# Patient Record
Sex: Male | Born: 1935 | Race: White | Hispanic: No | Marital: Single
Health system: Southern US, Community
[De-identification: ages and names within clinical notes are randomized; demographics above are authoritative.]

---

## 1997-10-08 ENCOUNTER — Other Ambulatory Visit: Admission: RE | Admit: 1997-10-08 | Discharge: 1997-10-08 | Payer: Self-pay | Admitting: Nephrology

## 1997-10-11 ENCOUNTER — Ambulatory Visit (HOSPITAL_BASED_OUTPATIENT_CLINIC_OR_DEPARTMENT_OTHER): Admission: RE | Admit: 1997-10-11 | Discharge: 1997-10-11 | Payer: Self-pay | Admitting: Plastic Surgery

## 1998-12-16 ENCOUNTER — Ambulatory Visit (HOSPITAL_BASED_OUTPATIENT_CLINIC_OR_DEPARTMENT_OTHER): Admission: RE | Admit: 1998-12-16 | Discharge: 1998-12-16 | Payer: Self-pay | Admitting: Plastic Surgery

## 2000-01-28 ENCOUNTER — Ambulatory Visit (HOSPITAL_COMMUNITY): Admission: RE | Admit: 2000-01-28 | Discharge: 2000-01-28 | Payer: Self-pay | Admitting: Gastroenterology

## 2000-12-26 ENCOUNTER — Ambulatory Visit (HOSPITAL_BASED_OUTPATIENT_CLINIC_OR_DEPARTMENT_OTHER): Admission: RE | Admit: 2000-12-26 | Discharge: 2000-12-26 | Payer: Self-pay | Admitting: Plastic Surgery

## 2000-12-26 ENCOUNTER — Encounter (INDEPENDENT_AMBULATORY_CARE_PROVIDER_SITE_OTHER): Payer: Self-pay | Admitting: Specialist

## 2001-06-28 ENCOUNTER — Ambulatory Visit (HOSPITAL_BASED_OUTPATIENT_CLINIC_OR_DEPARTMENT_OTHER): Admission: RE | Admit: 2001-06-28 | Discharge: 2001-06-28 | Payer: Self-pay | Admitting: Plastic Surgery

## 2001-06-28 ENCOUNTER — Encounter (INDEPENDENT_AMBULATORY_CARE_PROVIDER_SITE_OTHER): Payer: Self-pay | Admitting: Specialist

## 2006-06-10 ENCOUNTER — Ambulatory Visit (HOSPITAL_COMMUNITY): Admission: RE | Admit: 2006-06-10 | Discharge: 2006-06-10 | Payer: Self-pay | Admitting: Gastroenterology

## 2006-07-05 ENCOUNTER — Encounter: Admission: RE | Admit: 2006-07-05 | Discharge: 2006-07-05 | Payer: Self-pay | Admitting: Surgery

## 2008-08-27 IMAGING — CR DG ABDOMEN 2V
2 series · 2 of 2 positions shown · non-contrast
Comparison: none

CLINICAL DATA: Incomplete colonoscopy.
 ABDOMEN ? 2 VIEWS ? 06/10/06:

[w abdomen upright]
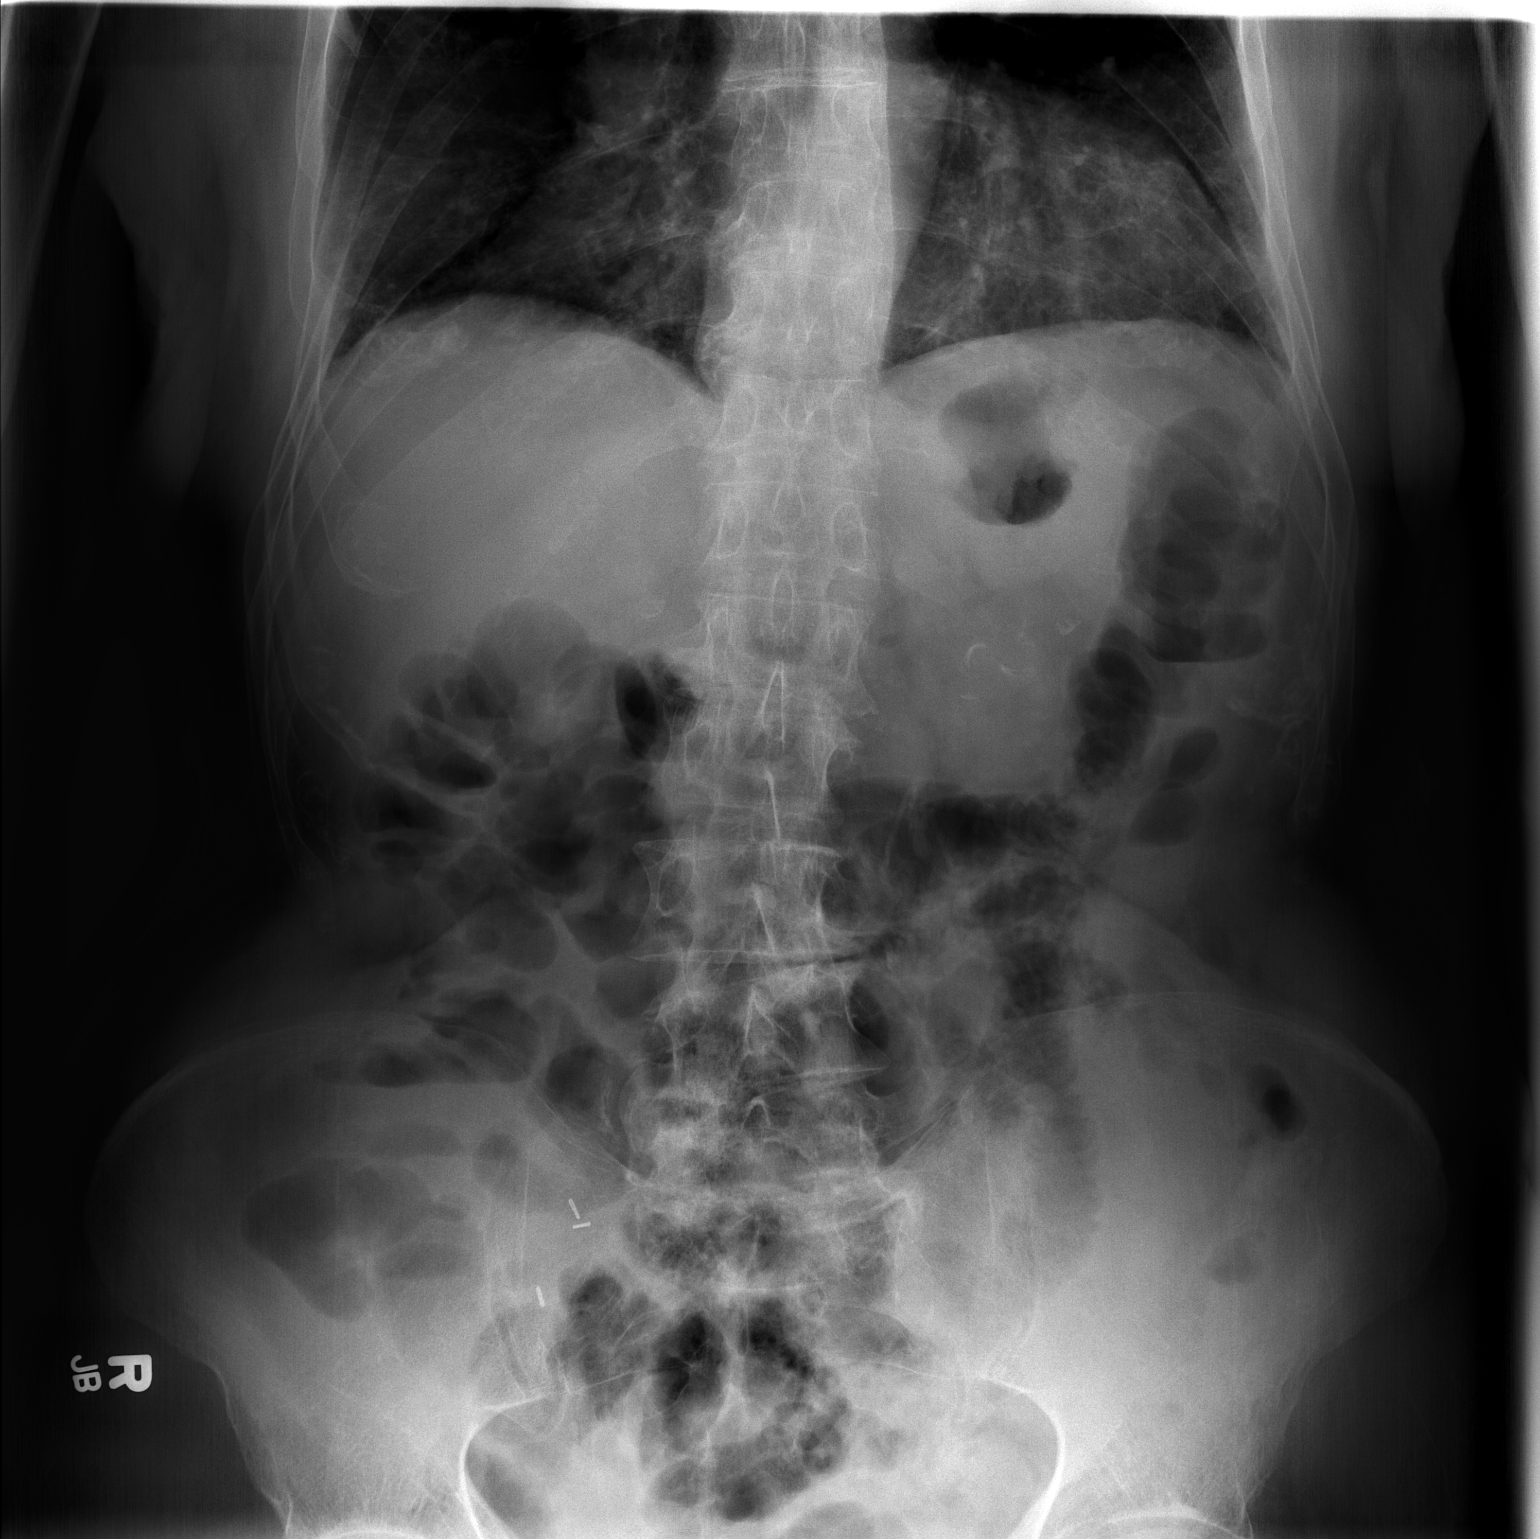

[t abdomen supine]
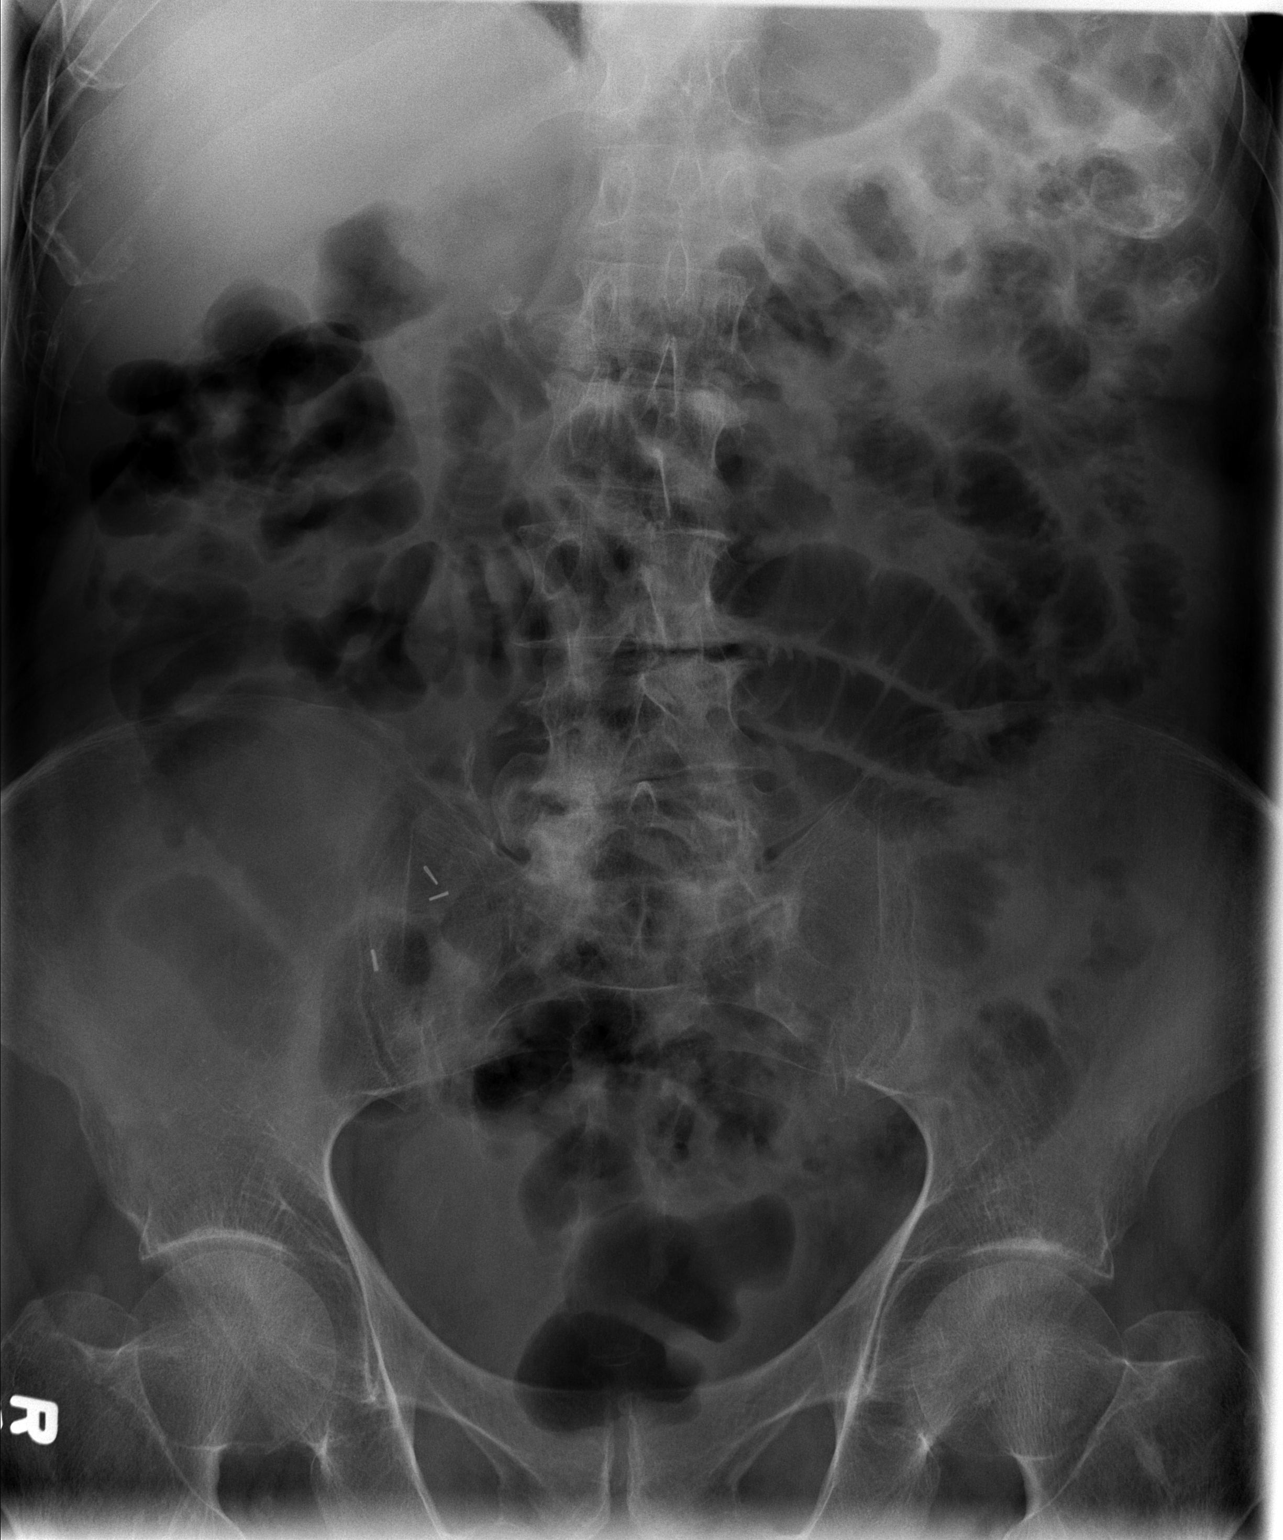

[2 of 2 positions shown; findings below may reference images not displayed]

FINDINGS: There is scattered gas in the large and small bowel.  There are a few loops of slightly distended small bowel in the left abdomen. Gas is noted in the rectum.  There is no extraluminal gas.
IMPRESSION: Mild nonspecific ileus.

## 2008-12-05 ENCOUNTER — Encounter: Admission: RE | Admit: 2008-12-05 | Discharge: 2008-12-05 | Payer: Self-pay | Admitting: Otolaryngology

## 2010-10-09 NOTE — Procedures (Signed)
[facility]  Patient:    Randy Mcdonald, Randy Mcdonald                          MRN: [id number] Proc. Date: 01/28/00 Attending:  Verlin Grills, M.D. CC:         Wilber Bihari. Caryn Section, M.D.   Procedure Report  PROCEDURE:  Colonoscopy.  REFERRING PHYSICIAN:  Marina Gravel, M.D.  INDICATION FOR PROCEDURE:  Mr. Dozier Berkovich is a 77 year old male. In 1996, he underwent a colonoscopy and an adenomatous but noncancerous polyp was removed. He is due for surveillance colonoscopy.  I discussed with the patient the complications associated with colonoscopy and polypectomy including intestinal bleeding and intestinal perforation. The patient has signed the operative permit.  ENDOSCOPIST:  Verlin Grills, M.D.  PREMEDICATION:  Demerol 50 mg, Versed 7 mg.  ENDOSCOPE:  Olympus pediatric colonoscope.  DESCRIPTION OF PROCEDURE:  After obtaining informed consent, the patient was placed in the left lateral decubitus position. I administered intravenous Demerol and intravenous Versed to achieve conscious sedation for the procedure. The patients cardiac rhythm, oxygen saturation and blood pressure were monitored throughout the procedure and documented in the medical record.  Anal inspection was normal. Digital rectal exam revealed a non-nodular prostate. The Olympus pediatric video colonoscope was introduced into the rectum and under direct vision advanced to the cecum as identified by a normal appearing ileocecal valve. Colonic preparation for the exam today was satisfactory.  Mr. Holub does have universal colonic diverticulosis but there is no evidence of diverticulitis.  RECTUM:  Normal.  SIGMOID COLON/DESCENDING COLON:  Normal.  SPLENIC FLEXURE:  Normal.  TRANSVERSE COLON:  Normal.  HEPATIC FLEXURE:  Normal.  ASCENDING COLON:  Normal.  CECUM/ILEOCECAL VALVE:  Normal.  ASSESSMENT:  Universal colonic diverticulosis but no evidence of  colorectal neoplasia.  RECOMMENDATIONS:  Repeat colonoscopy in 5 years. DD:  01/28/00 TD:  01/29/00 Job: [id number]

## 2010-10-09 NOTE — Op Note (Signed)
[name], [name]                 ACCOUNT NO.:  [id number]   MEDICAL RECORD NO.:  [id number]          PATIENT TYPE:  AMB   LOCATION:  ENDO                         FACILITY:  [facility]   PHYSICIAN:  Danise Edge, M.D.   DATE OF BIRTH:  Nov 14, 1930   DATE OF PROCEDURE:  06/10/2006  DATE OF DISCHARGE:                               OPERATIVE REPORT   REFERRING PHYSICIAN:  Dr. Marina Gravel.   INDICATIONS:  Mr. Randy Mcdonald is a 78 year old male born May 31, 1930.   In 1996, he underwent a screening colonoscopy and an adenomatous polyp  was removed.   In September 2001, his surveillance proctocolonoscopy to the cecum  revealed universal colonic diverticulosis but no recurrent colorectal  neoplasia.   Randy Mcdonald was scheduled to undergo a surveillance colonoscopy with  polypectomy to prevent colon cancer.  Due to colonic loop formation, I  was unable to complete a full colonoscopy.   ENDOSCOPIST:  Danise Edge, M.D.   PREMEDICATION:  Fentanyl 25 mcg, Versed 4 mg.   PROCEDURE:  After obtaining informed consent, Randy Mcdonald was placed in the  left lateral decubitus position.  I administered intravenous fentanyl  and intravenous Versed to achieve conscious sedation for the procedure.  The patient's blood pressure, oxygen saturation and cardiac rhythm were  monitored throughout the procedure and documented in the medical record.   Anal inspection and digital rectal exam were normal.  The prostate was  non-nodular.  The Pentax pediatric colonoscope was introduced into the  rectum and only advanced to approximately 40 cm from the anal verge, at  which point further colonoscopic advancement could not be performed due  to colonic loop formation.  Despite repositioning the patient from the  left lateral decubitus position to the supine position and finally the  right lateral decubitus position applying external abdominal pressure,  further advancement of the Pentax pediatric colonoscope was  not  possible.   Endoscopic appearance of the rectum and distal sigmoid colon with the  Pentax colonoscope advanced to 40 cm from the anal verge was normal  except for the presence of left colonic diverticulosis.   I will schedule Randy Mcdonald for an air contrast barium enema to visualize  the remainder of his colon.           ______________________________  Danise Edge, M.D.    MJ/MEDQ  D:  06/10/2006  T:  06/10/2006  Job:  [id number]

## 2010-10-09 NOTE — Op Note (Signed)
. [facility]  Patient:    Randy Mcdonald, Randy Mcdonald Visit Number: [id number] MRN: [id number]          Service Type: DSU Location: [facility] Attending Physician:  Loura Halt Ii Dictated by:   Alfredia Ferguson, M.D. Proc. Date: 06/28/01 Admit Date:  06/28/2001                             Operative Report  PREOPERATIVE DIAGNOSIS: 1. A 4 mm squamous cell carcinoma, left ear helix superior portion. 2. Squamous cell carcinoma, right ear helix, 5 mm, superior portion.  POSTOPERATIVE DIAGNOSIS: 1. A 4 mm squamous cell carcinoma, left ear helix superior portion. 2. Squamous cell carcinoma, right ear helix, 5 mm, superior portion.  OPERATION PERFORMED:  Elliptical excision of squamous cell carcinomas x 2, left and right ear helix.  SURGEON:  Alfredia Ferguson, M.D.  ANESTHESIA:  2% Xylocaine, 1:100,000 epinephrine.  INDICATIONS FOR PROCEDURE:  The patient is a 77 year old gentleman who is status post renal transplant.  He is on immunosuppressive drugs.  He has had multiple skin malignancies.  He has two new biopsy proven squamous cell carcinomas.  Both are located on the superior helix of his left and his right ear.  He wishes to have them excised.  The plan is to elliptically excise the areas with approximately 2 mm margins.  The patient understands that it may cause some flattening of the ear helix after excision.  In spite of that, he wishes to proceed.  DESCRIPTION OF PROCEDURE:  Skin marks were placed both around the left and right ear helix lesion and local anesthesia was infiltrated.  The right ear was first prepped and draped in sterile fashion.  After waiting approximately five minutes, an elliptical excision of the lesion was carried out. Hemostasis was accomplished using pressure.  The wound was closed by approximating the skin edges using a 5-0 nylon suture.  A light dressing was applied.  The patients head was turned towards the right and the  left ear was not prepped and draped in a sterile fashion.  An identical procedure was done on the left ear helix.  The patient tolerated the procedure well.  A light dressing was applied.  The patient was discharged home in satisfactory condition. Dictated by:   Alfredia Ferguson, M.D. Attending Physician:  Loura Halt Ii DD:  06/28/01 TD:  06/28/01 Job: [id number]

## 2010-10-09 NOTE — Op Note (Signed)
[place]  Patient:    Randy Mcdonald, Randy Mcdonald                          MRN: [id number] Proc. Date: 11/25/00 Adm. Date:  [id number] Disc. Date: [id number] Attending:  Loura Halt Ii CC:         Hope M. Danella Deis, M.D.   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  OPERATION PERFORMED:  Excision of squamous cell carcinoma of the left preauricular area.  SURGEON:  Alfredia Ferguson, M.D.  ANESTHESIA:  2% Xylocaine 1:200,000 epinephrine.  INDICATIONS FOR PROCEDURE:  The patient is a 77 year old gentleman with a long history of multiple skin malignancies.  He presents with a biopsy proven squamous cell carcinoma of the left preauricular area.  It is noted before beginning the procedure, there are two adjacent lesions.  It is not certain which of the two was a biopsy proven lesion and as a result, I opted to remove both within the same incision.  The patient understands he will be trading what he has for a permanent potentially unsightly scar.  In spite of that he wishes to proceed with the operation.  DESCRIPTION OF PROCEDURE:  An elliptical skin mark was placed in the left preauricular area vertically oriented incorporating two of the scaly lesions in the preauricular area.  Local anesthesia was infiltrated and the area was prepped and draped in a sterile fashion.  An elliptical excision of the lesion down to the level of the subcutaneous tissue was carried out and the lesion was passed off for pathology.  Skin edges were undermined for a distance of about 1 cm medially and about 0.5 cm posteriorly.  Hemostasis was accomplished using pressure.  The wound was closed by approximating the dermis using interrupted 4-0 Vicryl suture.  The skin was united using a running 6-0 nylon suture.  The patient  tolerated the procedure well with minimal blood loss. A light dressing was applied and the patient was discharged home in satisfactory condition. DD:  12/28/00 TD:  12/28/00 Job: [id number]

## 2011-12-21 ENCOUNTER — Other Ambulatory Visit: Payer: Self-pay | Admitting: Cardiology

## 2011-12-21 DIAGNOSIS — N289 Disorder of kidney and ureter, unspecified: Secondary | ICD-10-CM

## 2012-01-05 ENCOUNTER — Encounter (INDEPENDENT_AMBULATORY_CARE_PROVIDER_SITE_OTHER): Payer: Medicare Other

## 2012-01-05 DIAGNOSIS — Z940 Kidney transplant status: Secondary | ICD-10-CM

## 2012-01-05 DIAGNOSIS — N289 Disorder of kidney and ureter, unspecified: Secondary | ICD-10-CM

## 2014-06-24 DEATH — deceased

## 2014-07-30 ENCOUNTER — Encounter (HOSPITAL_COMMUNITY): Payer: Self-pay | Admitting: Certified Registered"

## 2014-07-30 SURGERY — RADIOLOGY WITH ANESTHESIA
Anesthesia: Monitor Anesthesia Care

## 2014-07-30 NOTE — Anesthesia Preprocedure Evaluation (Deleted)
Anesthesia Evaluation    Airway        Dental   Pulmonary          Cardiovascular     Neuro/Psych Code stroke today    GI/Hepatic   Endo/Other    Renal/GU      Musculoskeletal   Abdominal   Peds  Hematology   Anesthesia Other Findings   Reproductive/Obstetrics                            Anesthesia Physical Anesthesia Plan Anesthesia Quick Evaluation

## 2014-07-31 ENCOUNTER — Inpatient Hospital Stay: Admit: 2014-07-31 | Payer: Self-pay | Admitting: Interventional Radiology
